# Patient Record
Sex: Female | Born: 2005 | Race: Black or African American | Hispanic: No | Marital: Single
Health system: Southern US, Community
[De-identification: ages and names within clinical notes are randomized; demographics above are authoritative.]

## PROBLEM LIST (undated history)

## (undated) ENCOUNTER — Inpatient Hospital Stay (HOSPITAL_COMMUNITY): Payer: Self-pay

---
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
## 2024-07-29 ENCOUNTER — Encounter (HOSPITAL_COMMUNITY): Payer: Self-pay | Admitting: Obstetrics and Gynecology

## 2024-07-29 ENCOUNTER — Inpatient Hospital Stay (HOSPITAL_COMMUNITY)
Admission: AD | Admit: 2024-07-29 | Discharge: 2024-07-29 | Disposition: A | Discharge status: Home / Self Care | Attending: Obstetrics and Gynecology | Admitting: Obstetrics and Gynecology

## 2024-07-29 ENCOUNTER — Inpatient Hospital Stay (HOSPITAL_COMMUNITY)

## 2024-07-29 DIAGNOSIS — Z3A01 Less than 8 weeks gestation of pregnancy: Secondary | ICD-10-CM | POA: Diagnosis not present

## 2024-07-29 DIAGNOSIS — O26851 Spotting complicating pregnancy, first trimester: Secondary | ICD-10-CM

## 2024-07-29 DIAGNOSIS — Z3490 Encounter for supervision of normal pregnancy, unspecified, unspecified trimester: Secondary | ICD-10-CM

## 2024-07-29 DIAGNOSIS — N939 Abnormal uterine and vaginal bleeding, unspecified: Secondary | ICD-10-CM

## 2024-07-29 LAB — WET PREP, GENITAL
Sperm: NONE SEEN
Trich, Wet Prep: NONE SEEN
WBC, Wet Prep HPF POC: >=10 — AB (ref <10)

## 2024-07-29 LAB — CBC
HCT: 32.6 % — ABNORMAL LOW (ref 36.0–46.0)
Hemoglobin: 10.5 g/dL — ABNORMAL LOW (ref 12.0–15.0)
MCH: 27.7 pg (ref 26.0–34.0)
MCHC: 32.2 g/dL (ref 30.0–36.0)
MCV: 86 fL (ref 80.0–100.0)
Platelets: 271 K/uL (ref 150–400)
RBC: 3.79 MIL/uL — ABNORMAL LOW (ref 3.87–5.11)
RDW: 13.6 % (ref 11.5–15.5)
WBC: 7.3 K/uL (ref 4.0–10.5)
nRBC: 0 % (ref 0.0–0.2)

## 2024-07-29 LAB — HCG, QUANTITATIVE, PREGNANCY: hCG, Beta Chain, Quant, S: 5025 m[IU]/mL — ABNORMAL HIGH (ref <5)

## 2024-07-29 LAB — ABO/RH: ABO/RH(D): O POS

## 2024-07-29 NOTE — MAU Note (Signed)
..  Chelsea Mejia is a 18 y.o. at Unknown here in MAU reporting: [facility] and cramping and was told she was having a miscarriage because HCG levels dropped and baby's heart rate was lower.  Now is here because she wants a second opinion. Is having moderate vaginal bleeding and intermittent lower abdominal cramping.  First OB appointment scheduled for Monday.  LMP: 9/31/2025  Pain score: 0/10 Vitals:   07/29/24 2042  BP: 120/65  Pulse: 99  Resp: 17  Temp: 98.5 F (36.9 C)  SpO2: 100%     FHT:n/a Lab orders placed from triage:  POCT preg reports she can not void.

## 2024-07-29 NOTE — MAU Provider Note (Signed)
 Chief Complaint:  Abdominal Pain and Vaginal Bleeding   HPI   None     Chelsea Mejia is a 18 y.o. No obstetric history on file. at Unknown who presents to maternity admissions reporting ***.   Pregnancy Course: ***  No past medical history on file. OB History  No obstetric history on file.   No past surgical history on file. No family history on file. Social History   Tobacco Use   Smoking status: Passive Smoke Exposure - Never Smoker   No Known Allergies Medications Prior to Admission  Medication Sig Dispense Refill Last Dose/Taking   ondansetron  (ZOFRAN  ODT) 4 MG disintegrating tablet Take 1 tablet (4 mg total) by mouth every 8 (eight) hours as needed for nausea. 6 tablet 0    trimethoprim -polymyxin b  (POLYTRIM ) ophthalmic solution Place 1 drop into the right eye every 4 (four) hours. While awake for 7 days 10 mL 0     I have reviewed patient's Past Medical Hx, Surgical Hx, Family Hx, Social Hx, medications and allergies.   ROS  Pertinent items noted in HPI and remainder of comprehensive ROS otherwise negative.   PHYSICAL EXAM  Patient Vitals for the past 24 hrs:  BP Temp Temp src Pulse Resp SpO2 Height Weight  07/29/24 2042 120/65 98.5 F (36.9 C) Oral 99 17 100 % 4' 11 (1.499 m) 53.3 kg    Constitutional: Well-developed, well-nourished female in no acute distress.  Cardiovascular: normal rate & rhythm, warm and well-perfused Respiratory: normal effort, no problems with respiration noted GI: Abd soft, non-tender, non-distended MS: Extremities nontender, no edema, normal ROM Neurologic: Alert and oriented x 4.  GU: no CVA tenderness Pelvic: normal external female genitalia, physiologic discharge, no blood, cervix clean.      Fetal Tracing: Baseline: Variability: Accelerations:  Decelerations: Toco:    Labs: Results for orders placed or performed during the hospital encounter of 07/29/24 (from the past 24 hours)  Wet prep, genital     Status: Abnormal    Collection Time: 07/29/24  9:02 PM  Result Value Ref Range   Yeast Wet Prep HPF POC PRESENT (A) NONE SEEN   Trich, Wet Prep NONE SEEN NONE SEEN   Clue Cells Wet Prep HPF POC PRESENT (A) NONE SEEN   WBC, Wet Prep HPF POC >=10 (A) <10   Sperm NONE SEEN   ABO/Rh     Status: None   Collection Time: 07/29/24  9:05 PM  Result Value Ref Range   ABO/RH(D) O POS    No rh immune globuloin      NOT A RH IMMUNE GLOBULIN CANDIDATE, PT RH POSITIVE Performed at [facility], [address], [place], [place] [address]   CBC     Status: Abnormal   Collection Time: 07/29/24  9:07 PM  Result Value Ref Range   WBC 7.3 4.0 - 10.5 K/uL   RBC 3.79 (L) 3.87 - 5.11 MIL/uL   Hemoglobin 10.5 (L) 12.0 - 15.0 g/dL   HCT 67.3 (L) 63.9 - 53.9 %   MCV 86.0 80.0 - 100.0 fL   MCH 27.7 26.0 - 34.0 pg   MCHC 32.2 30.0 - 36.0 g/dL   RDW 86.3 88.4 - 84.4 %   Platelets 271 150 - 400 K/uL   nRBC 0.0 0.0 - 0.2 %  hCG, quantitative, pregnancy     Status: Abnormal   Collection Time: 07/29/24  9:07 PM  Result Value Ref Range   hCG, Beta Chain, Quant, S 5,025 (  H) <5 mIU/mL    Imaging:  US  OB LESS THAN 14 WEEKS WITH OB TRANSVAGINAL Result Date: 07/29/2024 CLINICAL DATA:  Vaginal bleeding during pregnancy EXAM: OBSTETRIC <14 WK US  AND TRANSVAGINAL OB US  TECHNIQUE: Both transabdominal and transvaginal ultrasound examinations were performed for complete evaluation of the gestation as well as the maternal uterus, adnexal regions, and pelvic cul-de-sac. Transvaginal technique was performed to assess early pregnancy. COMPARISON:  None Available. FINDINGS: Intrauterine gestational sac: Single Yolk sac:  Visualized. Embryo:  Visualized. Cardiac Activity: Visualized. Heart Rate: Too slow to calculate bpm CRL:  4.1 mm   6 w   1 d                  US  EDC: 03/23/2025 Subchorionic hemorrhage:  None visualized. Maternal uterus/adnexae: Corpus luteum noted in the left ovary. Right ovary within normal limits. There is a small  amount of free fluid in the pelvis. IMPRESSION: 1. Single live intrauterine gestation measuring 6 weeks 1 day by crown-rump length. Cardiac activity is present, but heart rate is low/too slow to calculate bmp. Short-term follow-up ultrasound recommended to confirm viability. 2. Small amount of free fluid in the pelvis. Electronically Signed   By: Greig Pique M.D.   On: 07/29/2024 22:37    MDM & MAU COURSE  MDM: {[id number]}  MAU Course: Orders Placed This Encounter  Procedures   Wet prep, genital   US  OB LESS THAN 14 WEEKS WITH OB TRANSVAGINAL   CBC   hCG, quantitative, pregnancy   Diet NPO time specified   ABO/Rh   No orders of the defined types were placed in this encounter.  *** ASSESSMENT  No diagnosis found.  PLAN  Discharge home in stable condition with return precautions.  ***    Allergies as of 07/29/2024   No Known Allergies   Med Rec must be completed prior to using this [facility]***       Charlie Courts, MD  Family Medicine - Obstetrics Fellow

## 2024-07-29 NOTE — Discharge Instructions (Signed)
 You came into the hospital because you were not sure if you were having a miscarriage. The ultrasound we did here shows that the pregnancy is in the uterus but the heart rate of the fetus is too low to detect, which can indicate a miscarriage. Over the next few days, please monitor for worsening cramping and worsening bleeding. Please go to your appointment at [facility] on Monday for a repeat ultrasound at that time.

## 2024-07-30 LAB — GC/CHLAMYDIA PROBE AMP (~~LOC~~) NOT AT ARMC
Chlamydia: NEGATIVE
Comment: NEGATIVE
Comment: NORMAL
Neisseria Gonorrhea: NEGATIVE
